# Patient Record
Sex: Male | Born: 1970 | Race: White | Hispanic: No | Marital: Married | Smoking: Former smoker
Health system: Southern US, Community
[De-identification: ages and names within clinical notes are randomized; demographics above are authoritative.]

## PROBLEM LIST (undated history)

## (undated) DIAGNOSIS — I10 Essential (primary) hypertension: Secondary | ICD-10-CM

## (undated) DIAGNOSIS — K219 Gastro-esophageal reflux disease without esophagitis: Secondary | ICD-10-CM

## (undated) HISTORY — PX: WISDOM TOOTH EXTRACTION: SHX21

## (undated) HISTORY — PX: APPENDECTOMY: SHX54

---

## 2008-06-10 ENCOUNTER — Inpatient Hospital Stay: Payer: Self-pay | Admitting: Internal Medicine

## 2008-06-10 ENCOUNTER — Ambulatory Visit: Payer: Self-pay | Admitting: Internal Medicine

## 2008-06-10 IMAGING — CT CT ABD-PELV W/ CM
2 of 3 series · 15 of 42 positions shown, 19 images · non-contrast
Comparison: none

REASON FOR EXAM: eval appendicitis  RLQ abd pn  fever CALL REPORT Dr COLIPAN
COLIPAN [PHONE_NUMBER]
COMMENTS:

[Series 2: appendicitis · axial · 0.72mm/px · z∈[-156,+288]mm · 12 of 171 slices shown, 16 images]
[im 15/171  soft-tissue]
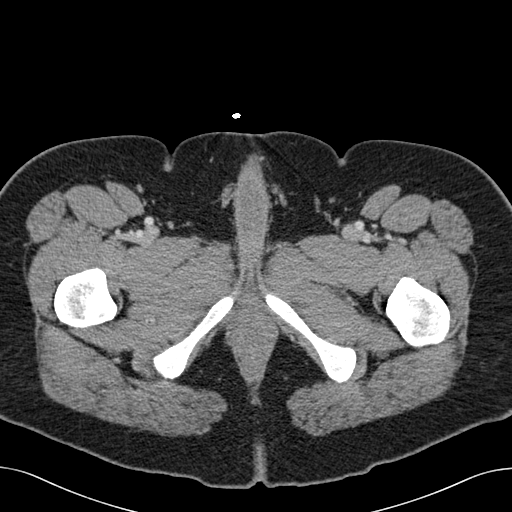
[im 15/171  bone]
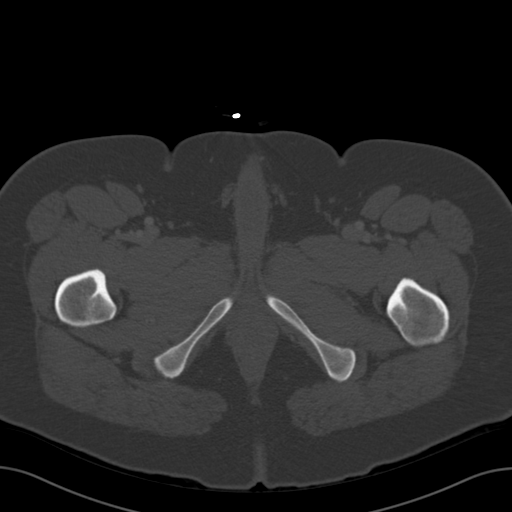
[im 29/171  soft-tissue]
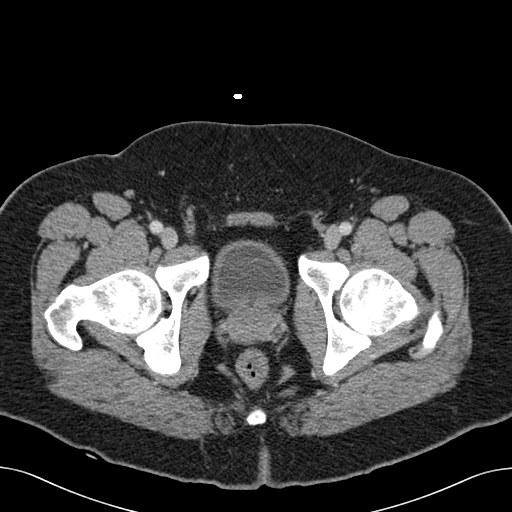
[im 43/171  soft-tissue]
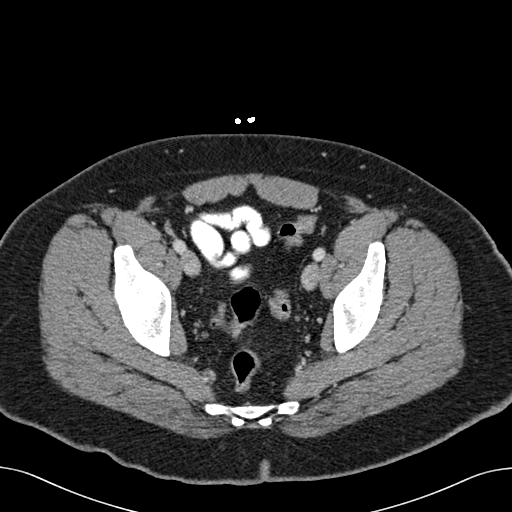
[im 64/171  soft-tissue]
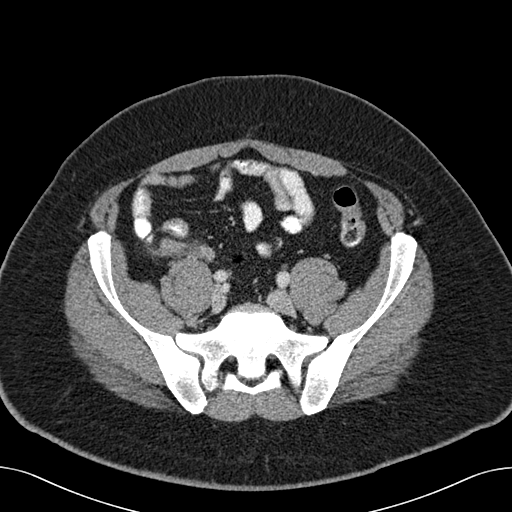
[im 78/171  soft-tissue]
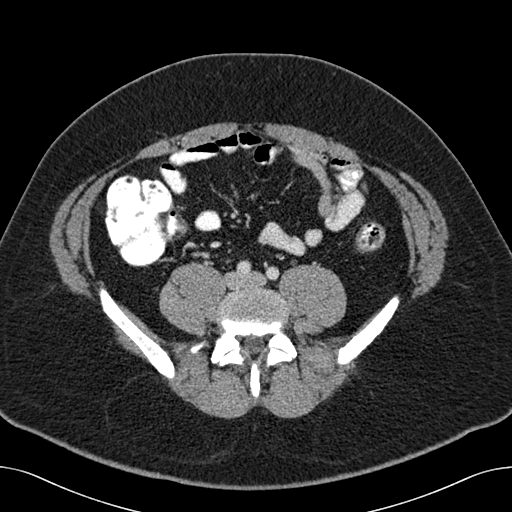
[im 93/171  soft-tissue]
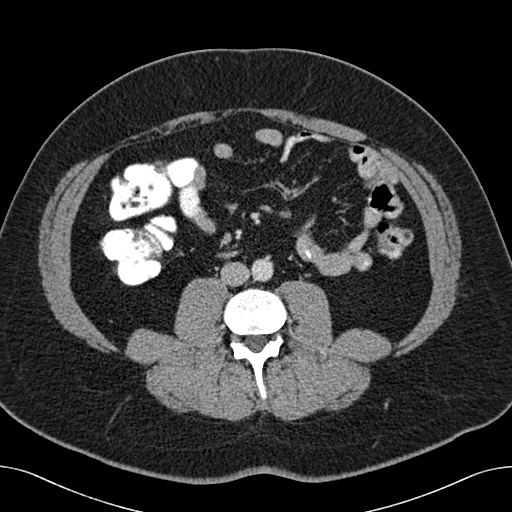
[im 107/171  soft-tissue]
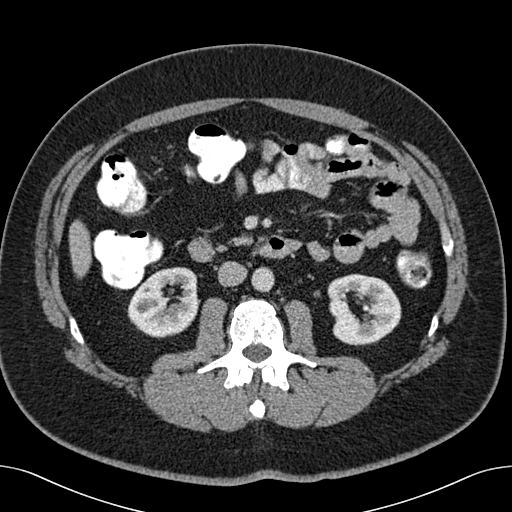
[im 128/171  soft-tissue]
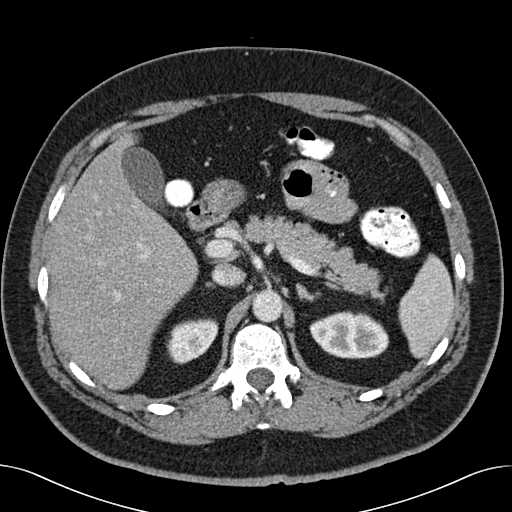
[im 142/171  soft-tissue]
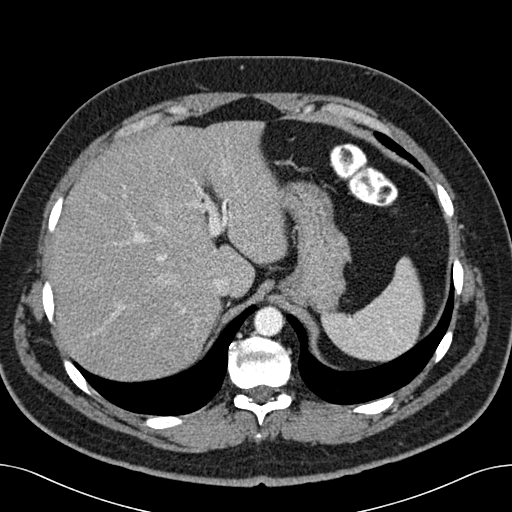
[im 142/171  lung]
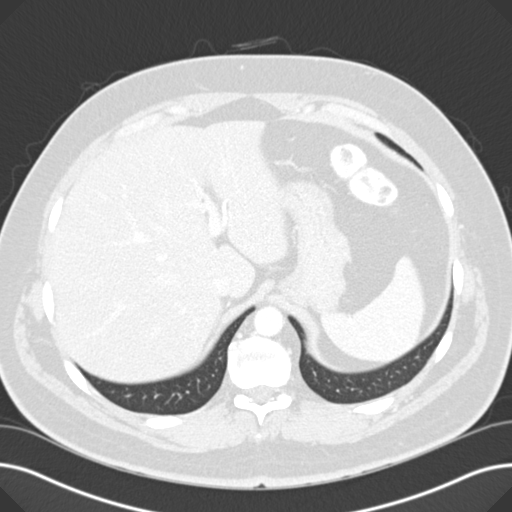
[im 142/171  bone]
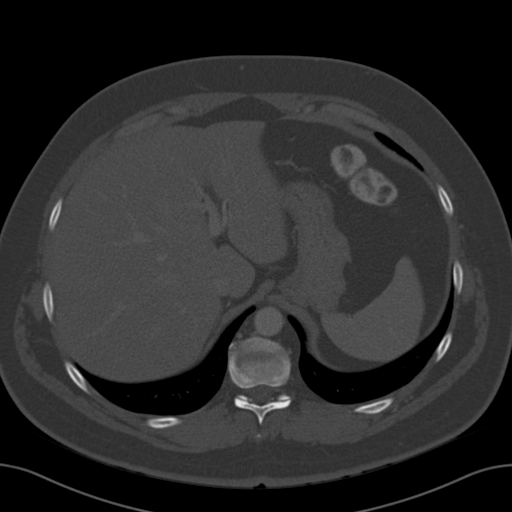
[im 149/171  lung]
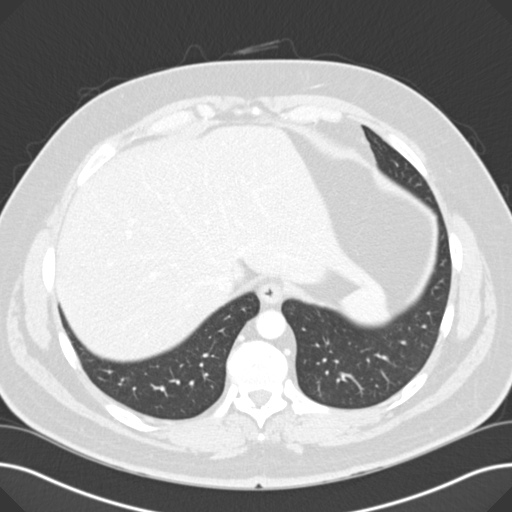
[im 156/171  soft-tissue]
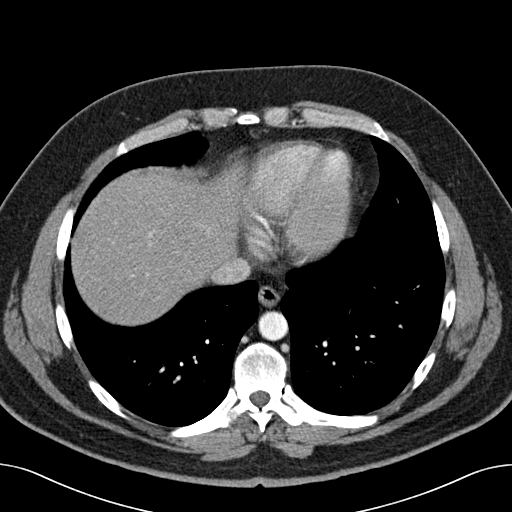
[im 156/171  lung]
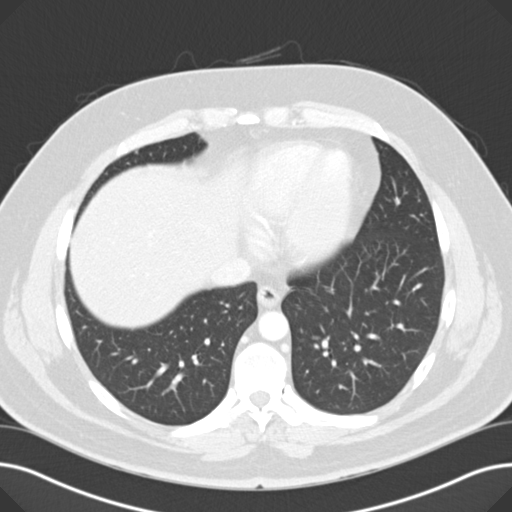
[im 163/171  lung]
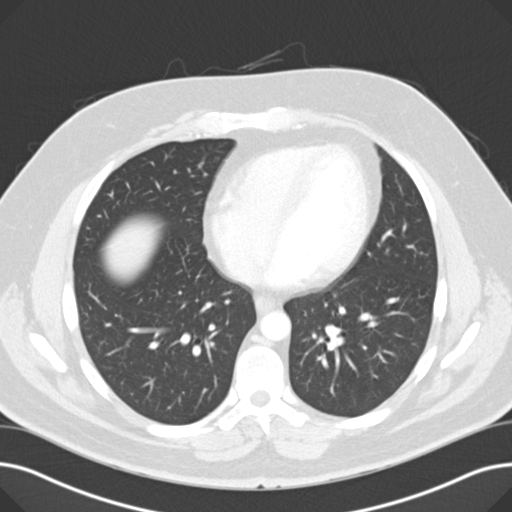

[Series 602: coronal · coronal · 1.02mm/px · 3 of 98 slices shown]
[im 33/98  soft-tissue]
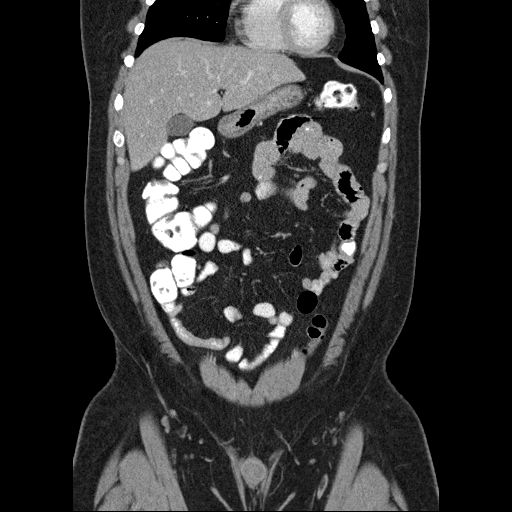
[im 44/98  soft-tissue]
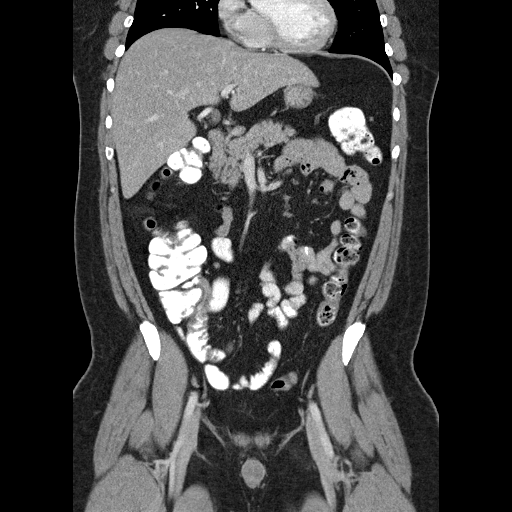
[im 54/98  soft-tissue]
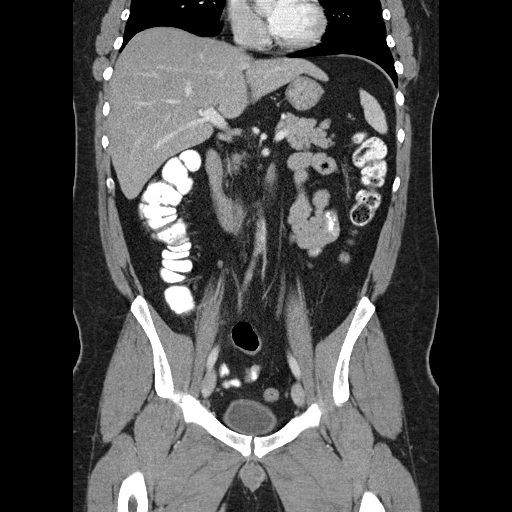

[15 of 42 positions shown; findings below may reference images not displayed]

PROCEDURE:     CT  - CT ABDOMEN / PELVIS  W  - [DATE]  [DATE]

RESULT:     The patient received 100 cc of Isovue 370 for this study. The
patient also received oral contrast material.

In the right lower quadrant of the abdomen there is increased density
surrounding the appendix, and the appendix itself appears mildly edematous.
This is seen on images 105 to 112. There is no evidence of bowel obstruction
or perforation. The remainder of the partially contrast filled colon is
normal in appearance. I see no abnormality of the small bowel.

The liver, gallbladder, spleen, nondistended stomach, adrenal glands,
pancreas, and kidneys exhibit no acute abnormality. The caliber of the
abdominal aorta is normal. There is no evidence of ascites. The partially
distended urinary bladder is normal in appearance. There is no inguinal nor
umbilical hernia. The lung bases are clear.
IMPRESSION: 1. There is mildly increased density in the periappendiceal fat and the
appendix itself is mildly edematous. These findings are consistent with
early appendicitis. There is no evidence of perforation or abscess
formation.
2. I do not see acute abnormality elsewhere within the abdomen or pelvis.

This report called directly to Dr. COLIPAN at the conclusion of the study.

## 2016-04-14 ENCOUNTER — Encounter: Payer: Self-pay | Admitting: *Deleted

## 2016-04-28 NOTE — Discharge Instructions (Signed)

## 2016-05-05 ENCOUNTER — Ambulatory Visit
Admission: RE | Admit: 2016-05-05 | Discharge: 2016-05-05 | Disposition: A | Discharge status: Home / Self Care | Payer: BLUE CROSS/BLUE SHIELD | Source: Ambulatory Visit | Attending: Unknown Physician Specialty | Admitting: Unknown Physician Specialty

## 2016-05-05 ENCOUNTER — Ambulatory Visit: Payer: BLUE CROSS/BLUE SHIELD | Admitting: Anesthesiology

## 2016-05-05 ENCOUNTER — Encounter
Admission: RE | Disposition: A | Discharge status: Home / Self Care | Payer: Self-pay | Source: Ambulatory Visit | Attending: Unknown Physician Specialty

## 2016-05-05 DIAGNOSIS — I10 Essential (primary) hypertension: Secondary | ICD-10-CM | POA: Insufficient documentation

## 2016-05-05 DIAGNOSIS — Z87891 Personal history of nicotine dependence: Secondary | ICD-10-CM | POA: Diagnosis not present

## 2016-05-05 DIAGNOSIS — R1310 Dysphagia, unspecified: Secondary | ICD-10-CM | POA: Insufficient documentation

## 2016-05-05 DIAGNOSIS — J383 Other diseases of vocal cords: Secondary | ICD-10-CM | POA: Insufficient documentation

## 2016-05-05 DIAGNOSIS — K219 Gastro-esophageal reflux disease without esophagitis: Secondary | ICD-10-CM | POA: Diagnosis not present

## 2016-05-05 HISTORY — DX: Essential (primary) hypertension: I10

## 2016-05-05 HISTORY — PX: MICROLARYNGOSCOPY: SHX5208

## 2016-05-05 HISTORY — DX: Gastro-esophageal reflux disease without esophagitis: K21.9

## 2016-05-05 SURGERY — MICROLARYNGOSCOPY
Anesthesia: General | Laterality: Left | Wound class: Clean Contaminated

## 2016-05-05 MED ORDER — MIDAZOLAM HCL 5 MG/5ML IJ SOLN
INTRAMUSCULAR | Status: DC | PRN
  Administered 2016-05-05: 2 mg via INTRAVENOUS

## 2016-05-05 MED ORDER — FENTANYL CITRATE (PF) 100 MCG/2ML IJ SOLN: 25.0000 ug | INTRAMUSCULAR | Status: DC | PRN

## 2016-05-05 MED ORDER — SUCCINYLCHOLINE CHLORIDE 20 MG/ML IJ SOLN
INTRAMUSCULAR | Status: DC | PRN
  Administered 2016-05-05: 100 mg via INTRAVENOUS

## 2016-05-05 MED ORDER — OXYCODONE HCL 5 MG PO TABS: 5.0000 mg | ORAL_TABLET | Freq: Once | ORAL | Status: DC | PRN

## 2016-05-05 MED ORDER — PROPOFOL 10 MG/ML IV BOLUS
INTRAVENOUS | Status: DC | PRN
  Administered 2016-05-05: 50 mg via INTRAVENOUS
  Administered 2016-05-05: 200 mg via INTRAVENOUS

## 2016-05-05 MED ORDER — GLYCOPYRROLATE 0.2 MG/ML IJ SOLN
INTRAMUSCULAR | Status: DC | PRN
  Administered 2016-05-05: 0.2 mg via INTRAVENOUS

## 2016-05-05 MED ORDER — DEXAMETHASONE SODIUM PHOSPHATE 4 MG/ML IJ SOLN
INTRAMUSCULAR | Status: DC | PRN
  Administered 2016-05-05: 8 mg via INTRAVENOUS

## 2016-05-05 MED ORDER — ONDANSETRON HCL 4 MG/2ML IJ SOLN
INTRAMUSCULAR | Status: DC | PRN
  Administered 2016-05-05: 4 mg via INTRAVENOUS

## 2016-05-05 MED ORDER — ACETAMINOPHEN 10 MG/ML IV SOLN
INTRAVENOUS | Status: DC | PRN
  Administered 2016-05-05: 1000 mg via INTRAVENOUS

## 2016-05-05 MED ORDER — OXYCODONE HCL 5 MG/5ML PO SOLN: 5.0000 mg | Freq: Once | ORAL | Status: DC | PRN

## 2016-05-05 MED ORDER — OXYCODONE-ACETAMINOPHEN 5-325 MG PO TABS: 1.0000 | ORAL_TABLET | Freq: Four times a day (QID) | ORAL | 0 refills | Status: DC | PRN

## 2016-05-05 MED ORDER — LACTATED RINGERS IV SOLN
INTRAVENOUS | Status: DC
  Administered 2016-05-05: 11:00:00 via INTRAVENOUS

## 2016-05-05 MED ORDER — LIDOCAINE HCL (CARDIAC) 20 MG/ML IV SOLN
INTRAVENOUS | Status: DC | PRN
  Administered 2016-05-05: 40 mg via INTRAVENOUS

## 2016-05-05 MED ORDER — ONDANSETRON HCL 4 MG/2ML IJ SOLN: 4.0000 mg | Freq: Once | INTRAMUSCULAR | Status: DC | PRN

## 2016-05-05 SURGICAL SUPPLY — 25 items
BASIN GRAD PLASTIC 32OZ STRL (MISCELLANEOUS) ×3 IMPLANT
BLOCK BITE GUARD (MISCELLANEOUS) ×3 IMPLANT
CNTNR SPEC 2.5X3XGRAD LEK (MISCELLANEOUS) ×1
CONT SPEC 4OZ STER OR WHT (MISCELLANEOUS) ×2
CONTAINER SPEC 2.5X3XGRAD LEK (MISCELLANEOUS) ×1 IMPLANT
COVER MAYO STAND STRL (DRAPES) ×3 IMPLANT
COVER TABLE BACK 60X90 (DRAPES) ×3 IMPLANT
CUP MEDICINE 2OZ PLAST GRAD ST (MISCELLANEOUS) ×3 IMPLANT
DRAPE SHEET LG 3/4 BI-LAMINATE (DRAPES) ×3 IMPLANT
DRESSING TELFA 4X3 1S ST N-ADH (GAUZE/BANDAGES/DRESSINGS) ×3 IMPLANT
GLOVE BIO SURGEON STRL SZ7.5 (GLOVE) ×3 IMPLANT
KIT ROOM TURNOVER OR (KITS) ×3 IMPLANT
MARKER SKIN DUAL TIP RULER LAB (MISCELLANEOUS) ×3 IMPLANT
NEEDLE FILTER BLUNT 18X 1/2SAF (NEEDLE) ×2
NEEDLE FILTER BLUNT 18X1 1/2 (NEEDLE) ×1 IMPLANT
NS IRRIG 500ML POUR BTL (IV SOLUTION) ×3 IMPLANT
PATTIES SURGICAL .5 X.5 (GAUZE/BANDAGES/DRESSINGS) ×3 IMPLANT
SOL ANTI-FOG 6CC FOG-OUT (MISCELLANEOUS) ×1 IMPLANT
SOL FOG-OUT ANTI-FOG 6CC (MISCELLANEOUS) ×2
SPONGE XRAY 4X4 16PLY STRL (MISCELLANEOUS) ×3 IMPLANT
STRAP BODY AND KNEE 60X3 (MISCELLANEOUS) ×3 IMPLANT
SYRINGE 10CC LL (SYRINGE) ×3 IMPLANT
TOWEL OR 17X26 4PK STRL BLUE (TOWEL DISPOSABLE) ×3 IMPLANT
TUBING CONN 6MMX3.1M (TUBING) ×2
TUBING SUCTION CONN 0.25 STRL (TUBING) ×1 IMPLANT

## 2016-05-05 NOTE — H&P (Signed)
  H+P  Reviewed and will be scanned in later. No changes noted. 

## 2016-05-05 NOTE — Transfer of Care (Signed)
Immediate Anesthesia Transfer of Care Note  Patient: Lance DuvalStephen B Coto  Procedure(s) Performed: Procedure(s): SUSPENSION MICROLARYNGOSCOPY WITH LEFT VOCAL CORD LESION EXCISION (Left)  Patient Location: PACU  Anesthesia Type: General ETT  Level of Consciousness: awake, alert  and patient cooperative  Airway and Oxygen Therapy: Patient Spontanous Breathing and Patient connected to supplemental oxygen  Post-op Assessment: Post-op Vital signs reviewed, Patient's Cardiovascular Status Stable, Respiratory Function Stable, Patent Airway and No signs of Nausea or vomiting  Post-op Vital Signs: Reviewed and stable  Complications: No apparent anesthesia complications

## 2016-05-05 NOTE — Anesthesia Procedure Notes (Deleted)
Performed by: Cyril Woodmansee       

## 2016-05-05 NOTE — Op Note (Signed)
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 After general endotracheal anesthesia the table was turned 90. A tooth guard was placed. A Dedo laryngoscope was introduced into the airway and suspended. 0 endoscope was introduced into the airway and examination of the larynx showed granulomatous lesion of the left posterior vocal fold over the vocal process. The operating microscope was brought into the field a cottonoid pledget with phenylephrine lidocaine was used to bathe the larynx. Approximate 5 minutes this was removed the 45 Schaap Shea microlaryngeal instruments were then introduced in the left vocal granuloma was excised in its entirety. Cottonoid pledget was then used to bathe the larynx again reinspection with the microscope as well as the 0 endoscope showed excellent removal of the granulomatous process. The right of the larynx was unremarkable. The patient was in return anesthesia where he was extubated in the operating room taken recovery room in stable condition.  Cultures: None  Specimen: Left vocal cord process granuloma  This may blood loss less than 5 cc  Dispo:   Good  Plan:  Discharge to home on voice rest  Tillman Kazmierski T  05/05/2016 12:11 PM

## 2016-05-05 NOTE — Anesthesia Procedure Notes (Signed)
Procedure Name: Intubation Performed by: Londell Moh Pre-anesthesia Checklist: Patient identified, Emergency Drugs available, Suction available, Patient being monitored and Timeout performed Patient Re-evaluated:Patient Re-evaluated prior to inductionOxygen Delivery Method: Circle system utilized Preoxygenation: Pre-oxygenation with 100% oxygen Intubation Type: IV induction Ventilation: Mask ventilation without difficulty Laryngoscope Size: Mac and 3 Grade View: Grade II Tube type: Oral Tube size: 7.0 mm Number of attempts: 1 Airway Equipment and Method: Bougie stylet and LTA kit utilized Placement Confirmation: ETT inserted through vocal cords under direct vision,  positive ETCO2 and breath sounds checked- equal and bilateral Tube secured with: Tape Dental Injury: Teeth and Oropharynx as per pre-operative assessment  Difficulty Due To: Difficult Airway- due to anterior larynx

## 2016-05-05 NOTE — Anesthesia Preprocedure Evaluation (Signed)
Anesthesia Evaluation  Patient identified by MRN, date of birth, ID band Patient awake    Reviewed: Allergy & Precautions, H&P , NPO status   Airway Mallampati: II  TM Distance: >3 FB Neck ROM: full    Dental   Pulmonary former smoker,    breath sounds clear to auscultation       Cardiovascular hypertension,  Rhythm:regular Rate:Normal     Neuro/Psych    GI/Hepatic GERD  ,  Endo/Other    Renal/GU      Musculoskeletal   Abdominal   Peds  Hematology   Anesthesia Other Findings   Reproductive/Obstetrics                             Anesthesia Physical Anesthesia Plan  ASA: II  Anesthesia Plan: General ETT   Post-op Pain Management:    Induction:   Airway Management Planned:   Additional Equipment:   Intra-op Plan:   Post-operative Plan:   Informed Consent: I have reviewed the patients History and Physical, chart, labs and discussed the procedure including the risks, benefits and alternatives for the proposed anesthesia with the patient or authorized representative who has indicated his/her understanding and acceptance.     Plan Discussed with: CRNA  Anesthesia Plan Comments:         Anesthesia Quick Evaluation

## 2016-05-05 NOTE — Anesthesia Postprocedure Evaluation (Signed)
Anesthesia Post Note  Patient: Lonna DuvalStephen B Barrette  Procedure(s) Performed: Procedure(s) (LRB): SUSPENSION MICROLARYNGOSCOPY WITH LEFT VOCAL CORD LESION EXCISION (Left)  Patient location during evaluation: PACU Anesthesia Type: General Level of consciousness: awake and alert Pain management: pain level controlled Vital Signs Assessment: post-procedure vital signs reviewed and stable Respiratory status: spontaneous breathing, nonlabored ventilation, respiratory function stable and patient connected to nasal cannula oxygen Cardiovascular status: blood pressure returned to baseline and stable Postop Assessment: no signs of nausea or vomiting Anesthetic complications: no    Durene Fruitshomas,  Basilio Meadow G

## 2016-05-08 ENCOUNTER — Encounter: Payer: Self-pay | Admitting: Unknown Physician Specialty

## 2016-05-10 LAB — SURGICAL PATHOLOGY

## 2018-05-29 ENCOUNTER — Other Ambulatory Visit: Payer: Self-pay | Admitting: Internal Medicine

## 2018-05-29 DIAGNOSIS — M25512 Pain in left shoulder: Secondary | ICD-10-CM

## 2022-03-03 ENCOUNTER — Other Ambulatory Visit: Payer: Self-pay | Admitting: Internal Medicine

## 2022-03-03 DIAGNOSIS — M549 Dorsalgia, unspecified: Secondary | ICD-10-CM

## 2022-03-14 ENCOUNTER — Ambulatory Visit
Admission: RE | Admit: 2022-03-14 | Discharge: 2022-03-14 | Disposition: A | Discharge status: Home / Self Care | Payer: BLUE CROSS/BLUE SHIELD | Source: Ambulatory Visit | Attending: Internal Medicine | Admitting: Internal Medicine

## 2022-03-14 ENCOUNTER — Ambulatory Visit
Admission: RE | Admit: 2022-03-14 | Discharge: 2022-03-14 | Disposition: A | Discharge status: Home / Self Care | Payer: BC Managed Care – PPO | Source: Ambulatory Visit | Attending: Internal Medicine | Admitting: Internal Medicine

## 2022-03-14 DIAGNOSIS — M549 Dorsalgia, unspecified: Secondary | ICD-10-CM

## 2022-03-14 IMAGING — MR MR LUMBAR SPINE WO/W CM
4 of 7 series · 19 of 48 positions shown · IV contrast (20 mL Multihance)
Comparison: None Available.

CLINICAL DATA: Mid and low back pain and weakness for 8 months.
Numbness in the left great toe.

EXAM:
MRI LUMBAR SPINE WITHOUT AND WITH CONTRAST
TECHNIQUE: Multiplanar and multiecho pulse sequences of the lumbar spine were
obtained without and with intravenous contrast.
CONTRAST:  20 mL MULTIHANCE GADOBENATE DIMEGLUMINE 529 MG/ML IV SOLN

[Series 1: T2 · sagittal · 4.0mm · 0.73mm/px · 4 of 15 slices shown (1 of 2)]
[im 1/15]
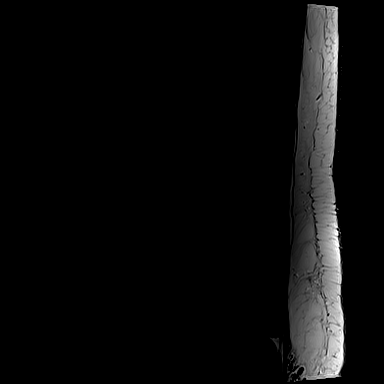
[im 5/15]
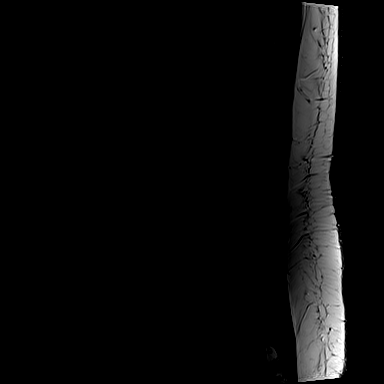
[im 10/15]
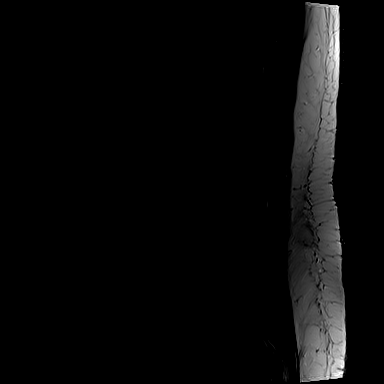
[im 15/15]
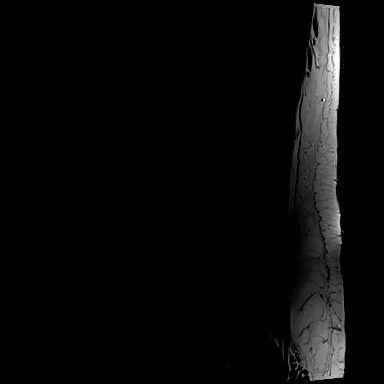

[Series 2: T1 · sagittal · 4.0mm · 0.73mm/px · 3 of 15 slices shown (1 of 2)]
[im 1/15]
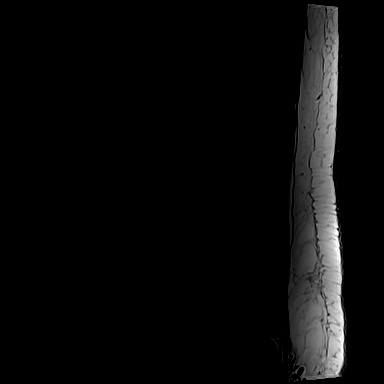
[im 10/15]
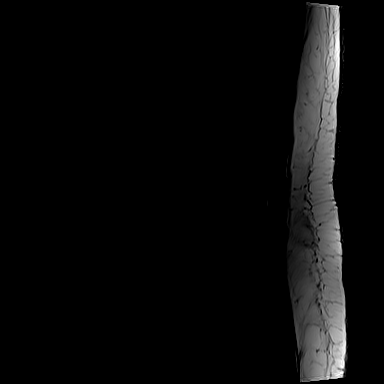
[im 15/15]
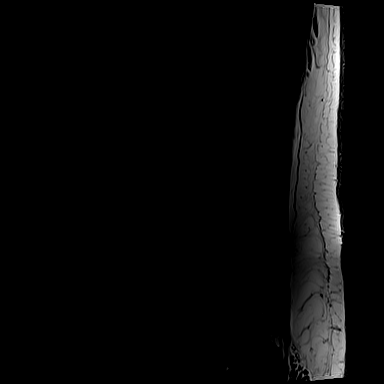

[Series 6: T1 · axial · 4.0mm · 0.28mm/px · z∈[-488,-315]mm · 3 of 42 slices shown (2 of 2)]
[im 6/42]
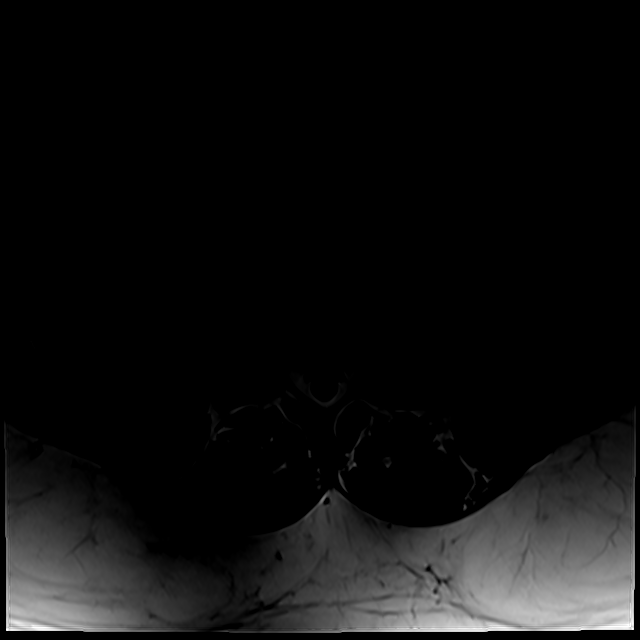
[im 21/42]
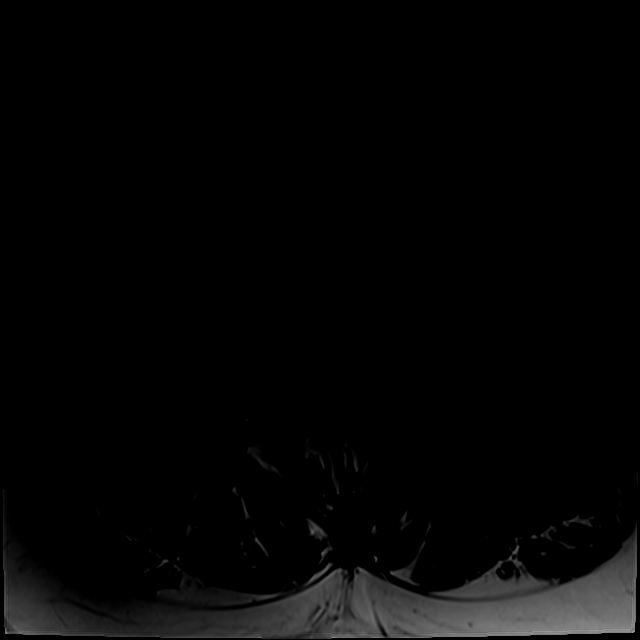
[im 36/42]
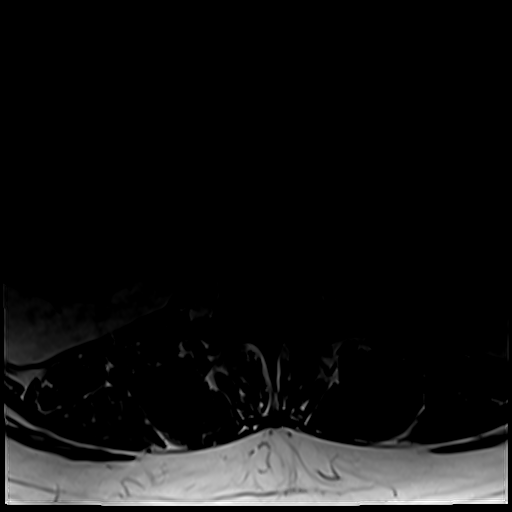

[Series 9: T2 · axial · 4.0mm · 0.28mm/px · z∈[-513,-285]mm · 9 of 42 slices shown (2 of 2)]
[im 1/42]
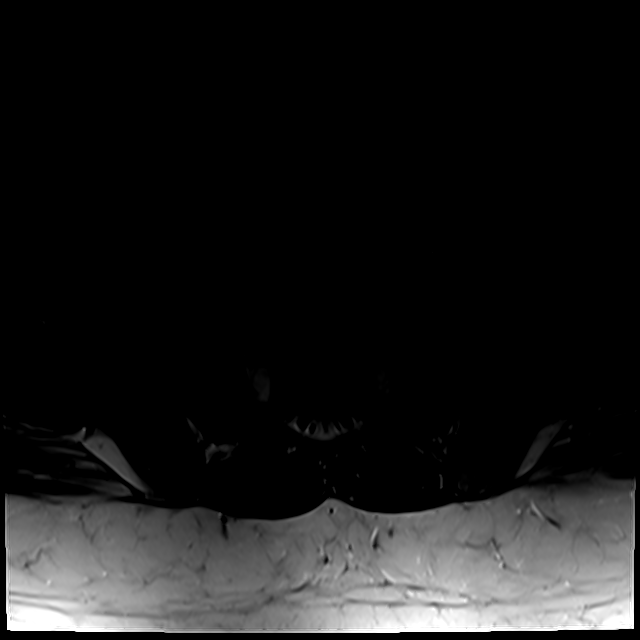
[im 6/42]
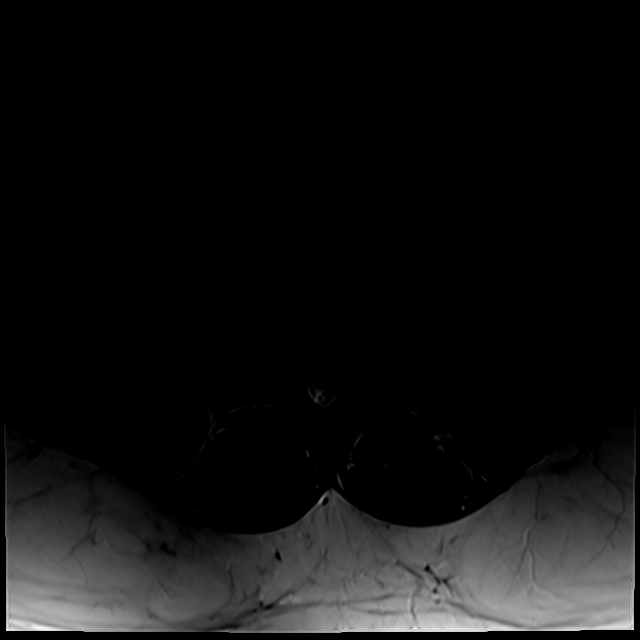
[im 11/42]
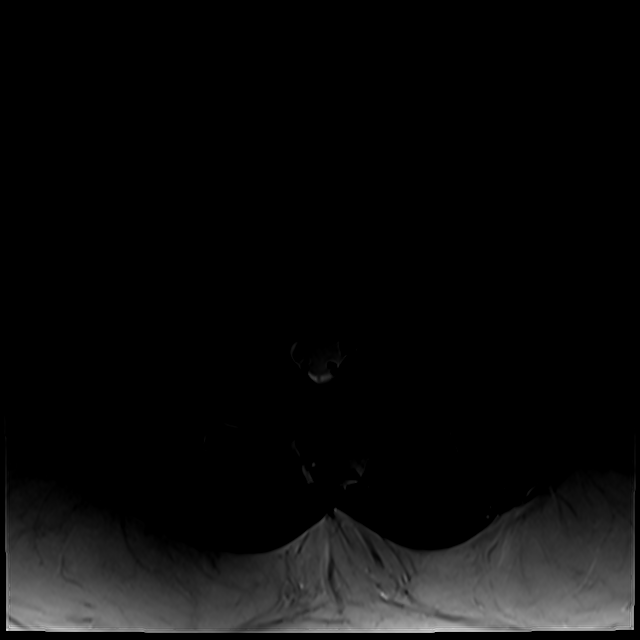
[im 16/42]
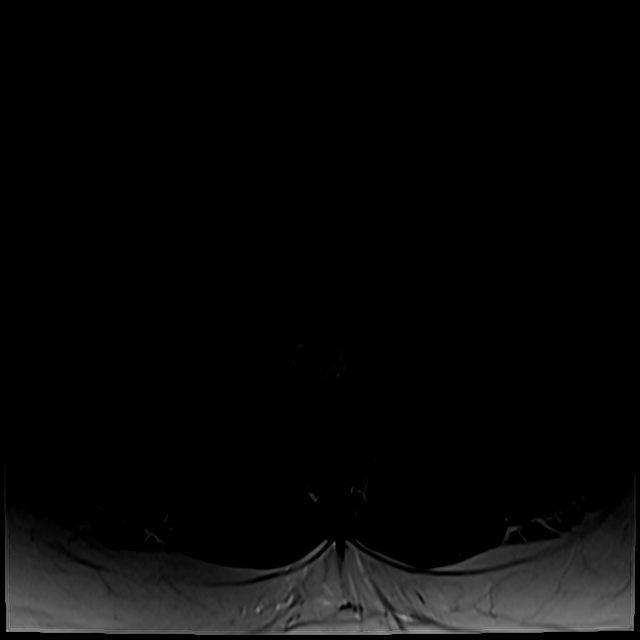
[im 21/42]
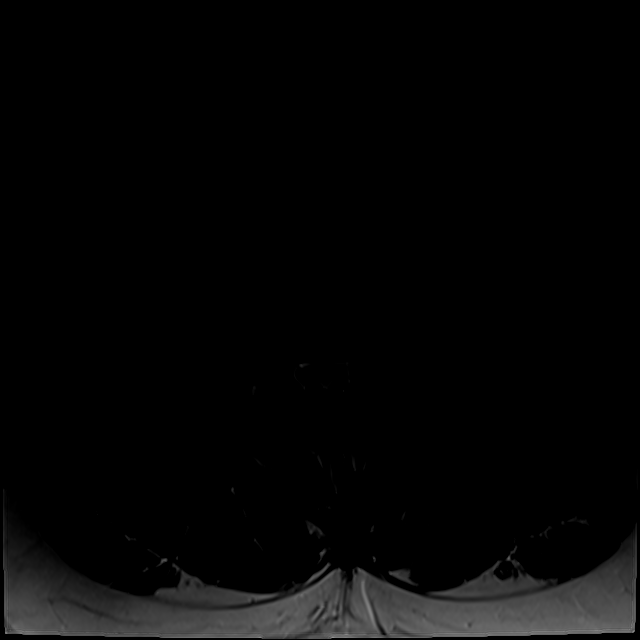
[im 26/42]
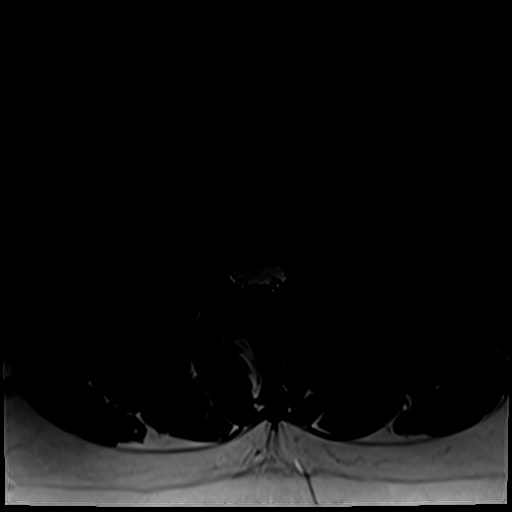
[im 31/42]
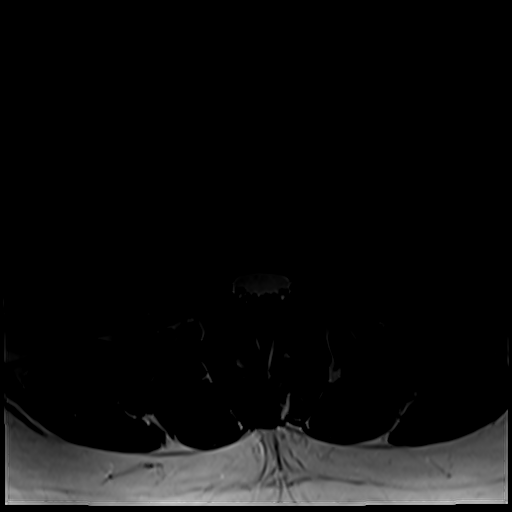
[im 36/42]
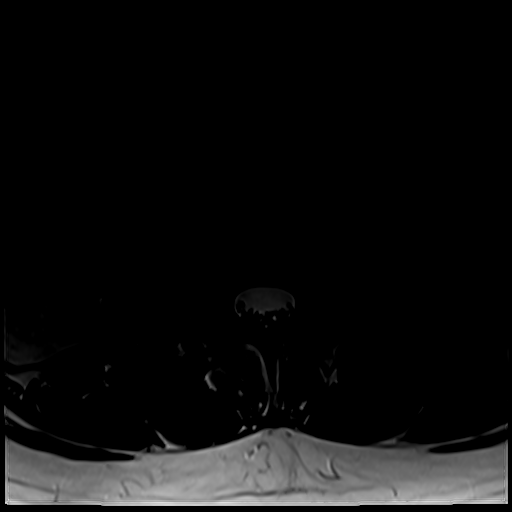
[im 42/42]
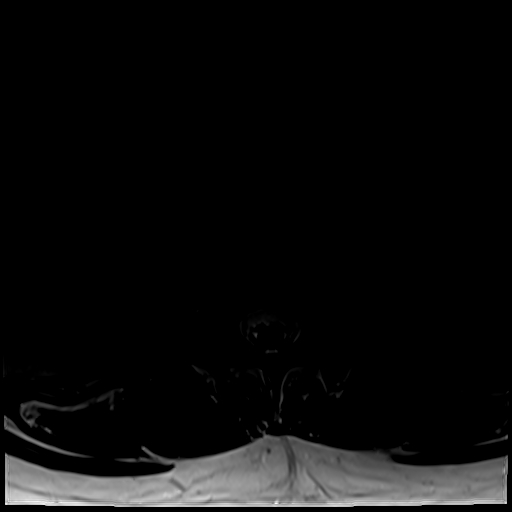

[19 of 48 positions shown; findings below may reference images not displayed]

FINDINGS: Segmentation:  Standard.

Alignment:  Normal.

Vertebrae: No fracture, evidence of discitis or worrisome bony
lesion. Benign hemangioma in L2 is noted.

Conus medullaris and cauda equina: Conus extends to the L1 level.
Conus and cauda equina appear normal.

Paraspinal and other soft tissues: Negative.

Disc levels:

The T12-L1 to L3-4 levels are negative.

L4-5: The patient has a shallow disc bulge and mild facet
degenerative change. The central canal is widely patent. Mild
bilateral foraminal narrowing is more notable on the right.

L5-S1: Shallow disc bulge without stenosis.
IMPRESSION: Shallow disc bulge at L4-5 results in mild bilateral foraminal
narrowing, more notable on the right. The central canal is widely
patent.

Minimal disc bulge L5-S1 without stenosis.

## 2022-03-14 IMAGING — MR MR THORACIC SPINE WO/W CM
5 of 9 series · 25 of 48 positions shown · IV contrast (20 mL Multihance)
Comparison: None Available.

EXAM:
MRI THORACIC WITHOUT AND WITH CONTRAST
TECHNIQUE: Multiplanar and multiecho pulse sequences of the thoracic spine were
obtained without and with intravenous contrast.

CONTRAST:  20mL MULTIHANCE GADOBENATE DIMEGLUMINE 529 MG/ML IV SOLN

[Series 6: T1 · sagittal · 3.0mm · 0.83mm/px · 3 of 15 slices shown (1 of 2)]
[im 1/15]
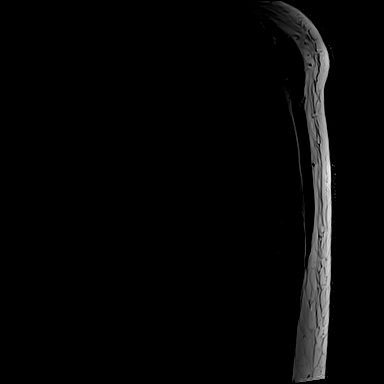
[im 8/15]
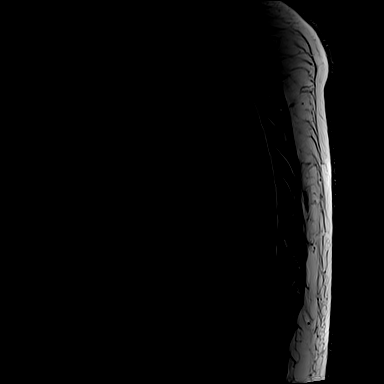
[im 15/15]
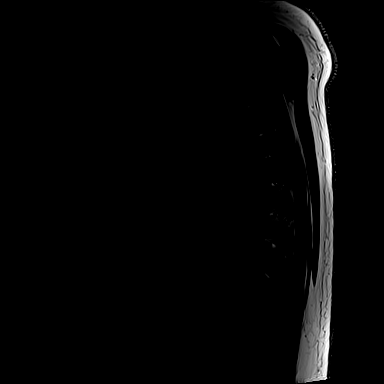

[Series 8: T2 · sagittal · 3.0mm · 0.83mm/px · 3 of 15 slices shown (1 of 2)]
[im 1/15]
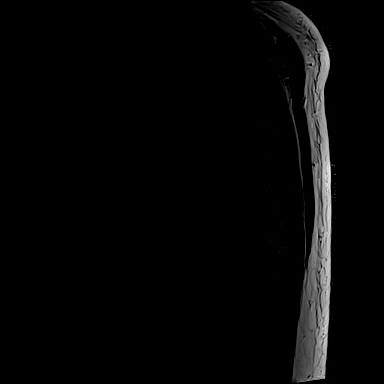
[im 8/15]
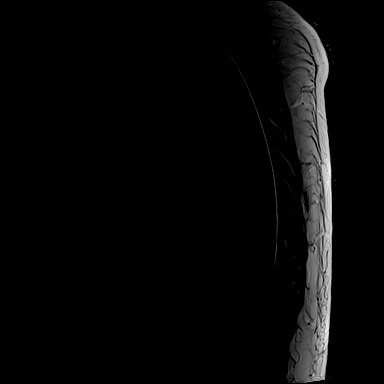
[im 15/15]
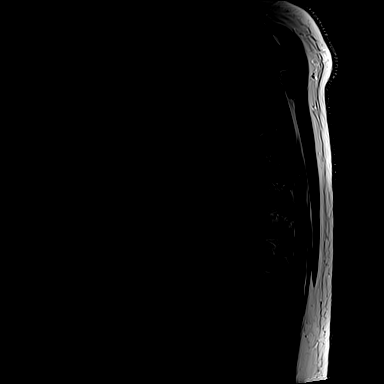

[Series 9: T2 · axial · 4.0mm · 0.28mm/px · z∈[-284,-73]mm · 8 of 36 slices shown (2 of 2)]
[im 1/36]
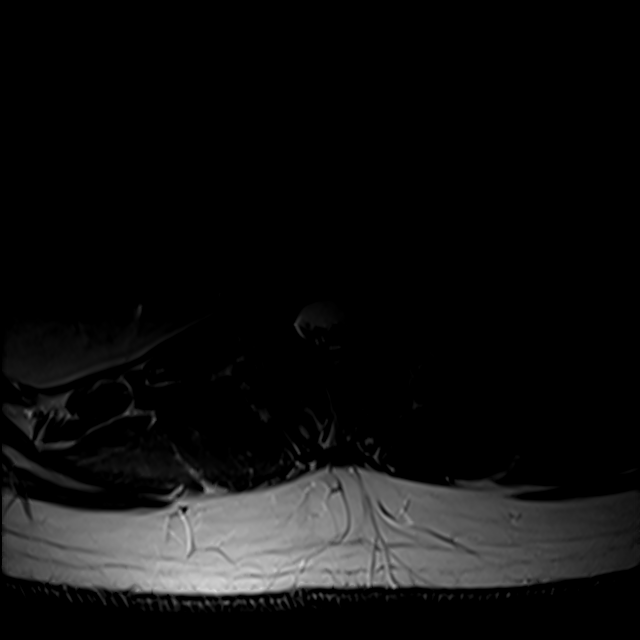
[im 6/36]
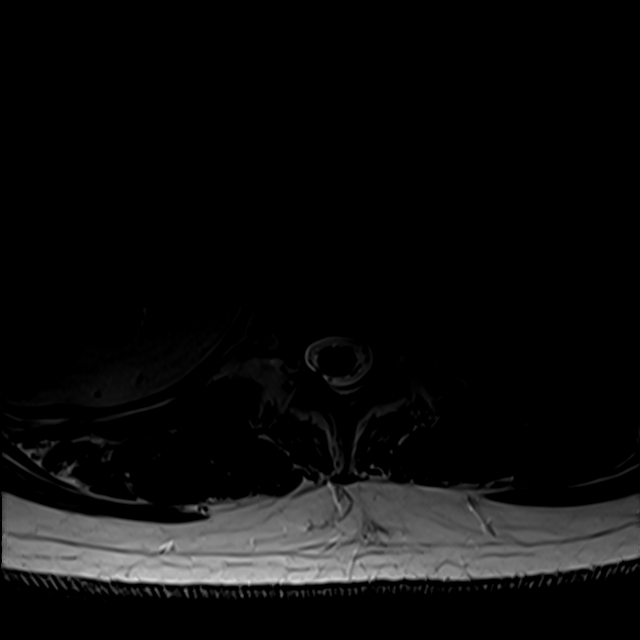
[im 11/36]
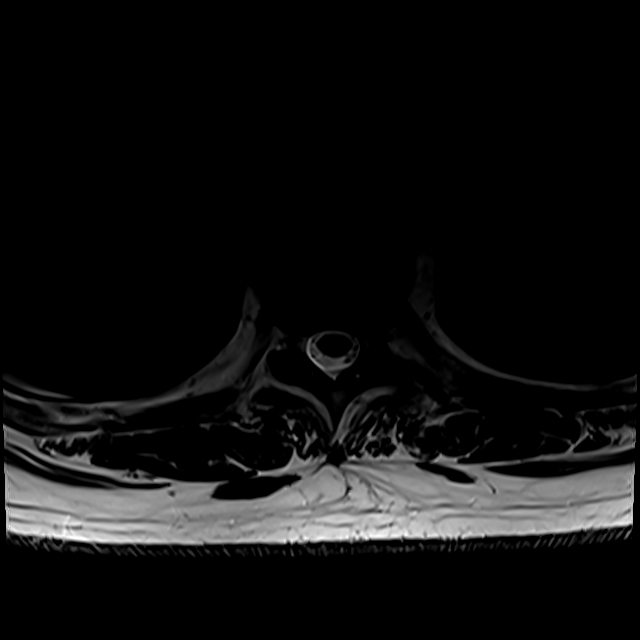
[im 16/36]
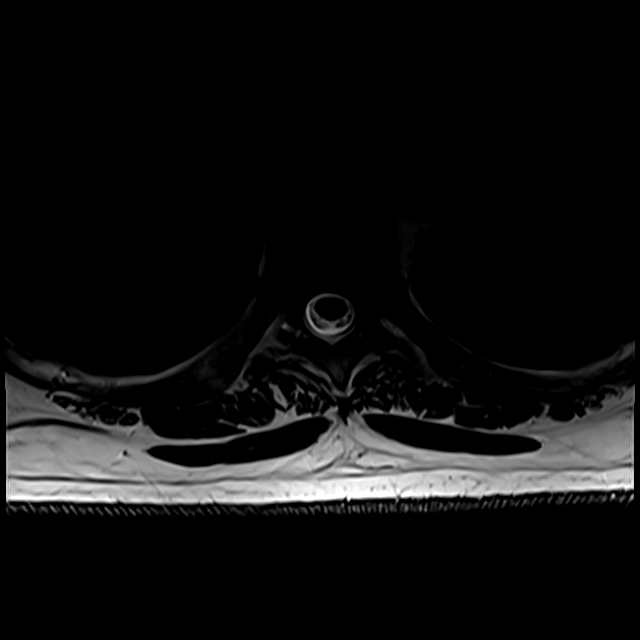
[im 21/36]
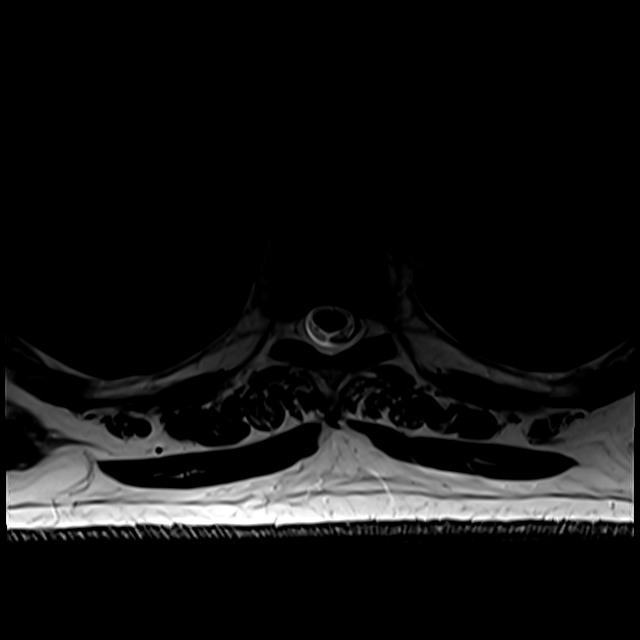
[im 26/36]
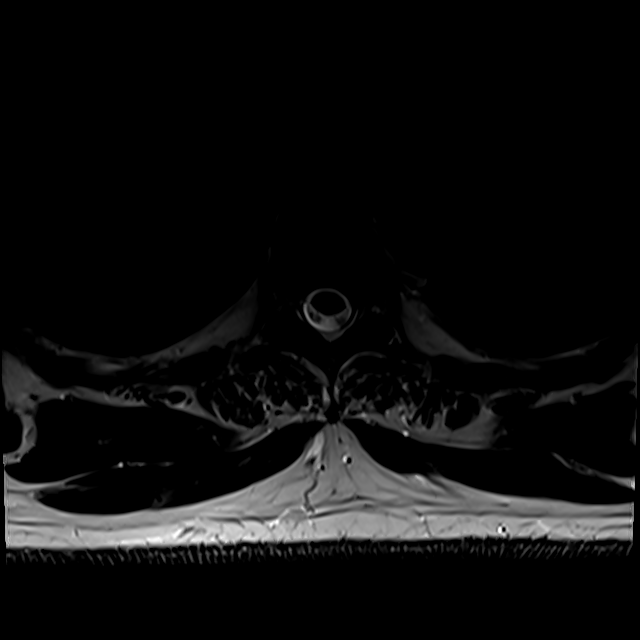
[im 31/36]
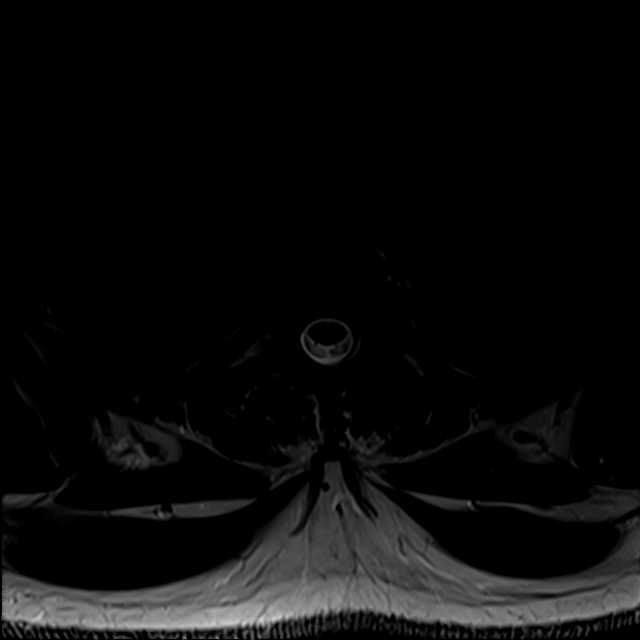
[im 36/36]
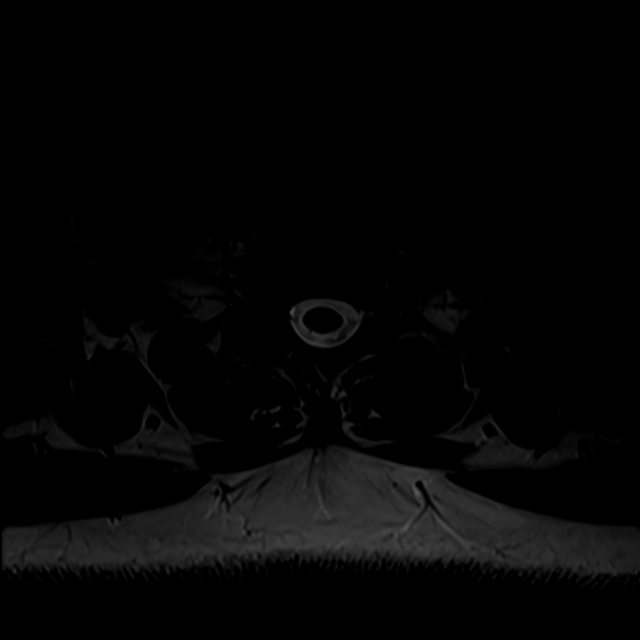

[Series 11: T1 · axial · non-contrast · 4.0mm · 0.56mm/px · z∈[-284,-73]mm · 8 of 36 slices shown (2 of 2)]
[im 1/36]
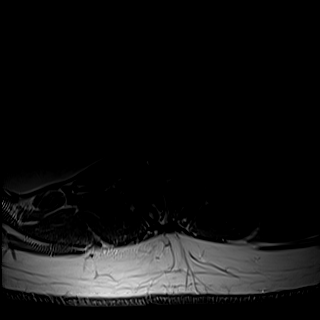
[im 6/36]
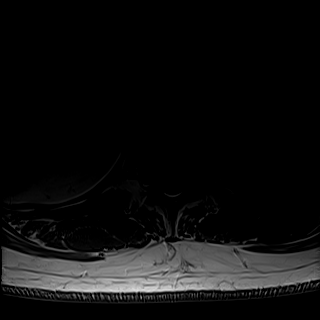
[im 11/36]
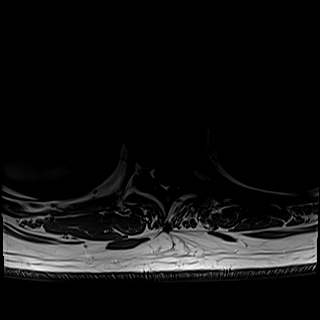
[im 16/36]
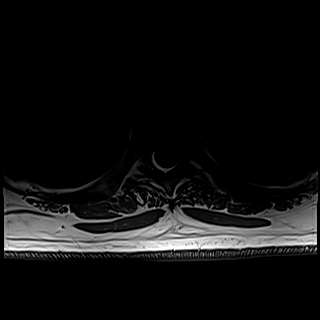
[im 21/36]
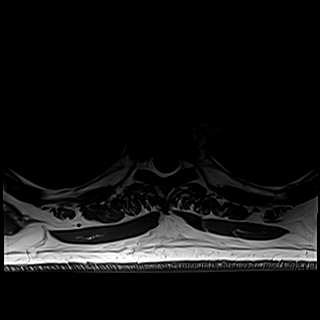
[im 26/36]
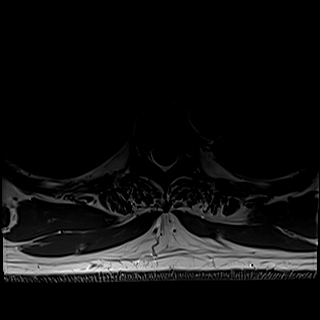
[im 31/36]
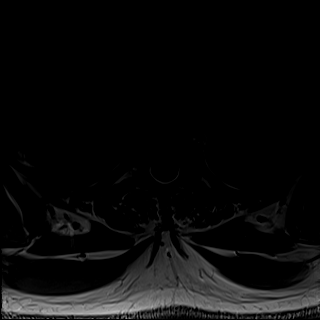
[im 36/36]
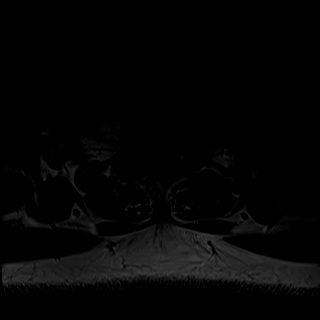

[Series 12: T1 fat-sat · sagittal · 3.0mm · 1.00mm/px · 3 of 15 slices shown]
[im 1/15]
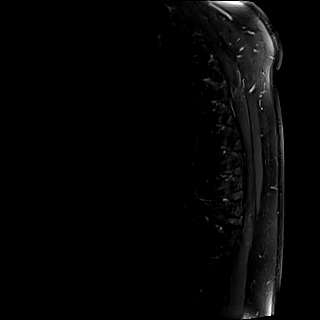
[im 8/15]
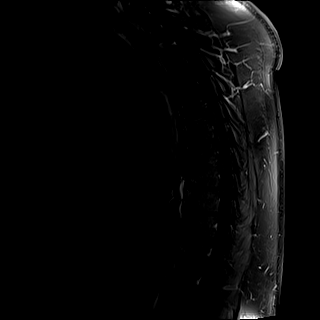
[im 15/15]
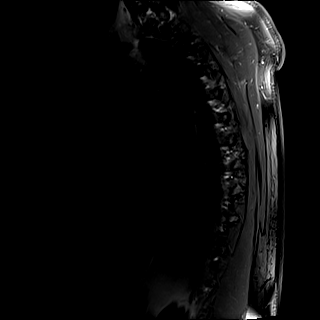

[25 of 48 positions shown; findings below may reference images not displayed]

FINDINGS: Alignment:  Physiologic.

Vertebrae: No fracture, evidence of discitis, or bone lesion.

Cord:  Normal signal and morphology.

Paraspinal and other soft tissues: Negative.

Disc levels:

Mild multilevel degenerate disc disease of the lower thoracic spine
with disc height loss. No significant disc protrusion, spinal canal
or neural foraminal stenosis.
IMPRESSION: 1.  Visualized cord is normal in signal and morphology.

2. Mild multilevel degenerate disc disease of the thoracic spine
without significant disc protrusion, spinal canal or neural
foraminal stenosis.

## 2022-03-14 MED ORDER — GADOBENATE DIMEGLUMINE 529 MG/ML IV SOLN
20.0000 mL | Freq: Once | INTRAVENOUS | Status: AC | PRN
  Administered 2022-03-14: 20 mL via INTRAVENOUS

## 2023-03-25 NOTE — Progress Notes (Addendum)
Referring Physician:  Barbette Reichmann, MD [address] [facility] [place],  [place] [address]  Primary Physician:  Barbette Reichmann, MD  History of Present Illness: 03/27/2023 Lance Buchanan has a history of OSA, depression, HTN, low vitamin B12 and D, likely fatty liver.   18 month history of constant upper LBP with no leg pain that is worse with moving, lifting, bending. Pain varies in severity. No pain with sitting. Thinks pain started after trying to lift his tractor 2 years ago. No numbness, tingling, or weakness.   He takes prn motrin.   Bowel/Bladder Dysfunction: none  Conservative measures:  Physical therapy: has not participated  Multimodal medical therapy including regular antiinflammatories: cymbalta, percocet, advil  Injections: has not received epidural steroid injections  Past Surgery: no previous spine surgery  Lance Buchanan has no symptoms of cervical myelopathy.  The symptoms are causing a significant impact on the patient's life.   Review of Systems:  A 10 point review of systems is negative, except for the pertinent positives and negatives detailed in the HPI.  Past Medical History: Past Medical History:  Diagnosis Date   GERD (gastroesophageal reflux disease)    Hypertension     Past Surgical History: Past Surgical History:  Procedure Laterality Date   APPENDECTOMY     MICROLARYNGOSCOPY Left 05/05/2016   Procedure: SUSPENSION MICROLARYNGOSCOPY WITH LEFT VOCAL CORD LESION EXCISION;  Surgeon: Linus Salmons, MD;  Location: [facility];  Service: ENT;  Laterality: Left;   WISDOM TOOTH EXTRACTION      Allergies: Allergies as of 03/27/2023 - Review Complete 03/27/2023  Allergen Reaction Noted   Erythromycin Other (See Comments) 04/14/2016   Penicillins Other (See Comments) 07/28/2014    Medications: Outpatient Encounter Medications as of 03/27/2023  Medication Sig   cyanocobalamin (VITAMIN B12) 1000 MCG tablet Take  1,000 mcg by mouth daily.   DULoxetine (CYMBALTA) 60 MG capsule Take 60 mg by mouth daily.   ibuprofen (ADVIL) 200 MG tablet Take 600 mg by mouth as needed.   rosuvastatin (CRESTOR) 10 MG tablet Take 10 mg by mouth daily.   buPROPion (WELLBUTRIN SR) 150 MG 12 hr tablet Take 150 mg by mouth daily.   metoprolol tartrate (LOPRESSOR) 25 MG tablet Take 25 mg by mouth daily.   omeprazole (PRILOSEC) 40 MG capsule Take 40 mg by mouth daily.   venlafaxine (EFFEXOR) 75 MG tablet Take 75 mg by mouth daily.   [DISCONTINUED] oxyCODONE-acetaminophen (ROXICET) 5-325 MG tablet Take 1-2 tablets by mouth every 6 (six) hours as needed for severe pain.   No facility-administered encounter medications on file as of 03/27/2023.    Social History: Social History   Tobacco Use   Smoking status: Former    Types: Cigarettes   Smokeless tobacco: Former    Quit date: 09/26/1995  Substance Use Topics   Alcohol use: Yes    Alcohol/week: 15.0 standard drinks of alcohol    Types: 15 Cans of beer per week    Family Medical History: History reviewed. No pertinent family history.  Physical Examination: Vitals:   03/27/23 1310  BP: (!) 140/90    General: Patient is well developed, well nourished, calm, collected, and in no apparent distress. Attention to examination is appropriate.  Respiratory: Patient is breathing without any difficulty.   NEUROLOGICAL:     Awake, alert, oriented to person, place, and time.  Speech is clear and fluent. Fund of knowledge is appropriate.   Cranial Nerves: Pupils equal round and reactive  to light.  Facial tone is symmetric.    No posterior lumbar tenderness.   No abnormal lesions on exposed skin.   Strength: Side Biceps Triceps Deltoid Interossei Grip Wrist Ext. Wrist Flex.  R 5 5 5 5 5 5 5   L 5 5 5 5 5 5 5    Side Iliopsoas Quads Hamstring PF DF EHL  R 5 5 5 5 5 5   L 5 5 5 5 5 5    Reflexes are 2+ and symmetric at the biceps, brachioradialis, patella and achilles.    Hoffman's is absent.  Clonus is not present.   Bilateral upper and lower extremity sensation is intact to light touch.     Gait is normal.    Medical Decision Making  Imaging: MRI lumbar spine dated 03/14/22:  FINDINGS: Segmentation:  Standard.   Alignment:  Normal.   Vertebrae: No fracture, evidence of discitis or worrisome bony lesion. Benign hemangioma in L2 is noted.   Conus medullaris and cauda equina: Conus extends to the L1 level. Conus and cauda equina appear normal.   Paraspinal and other soft tissues: Negative.   Disc levels:   The T12-L1 to L3-4 levels are negative.   L4-5: The patient has a shallow disc bulge and mild facet degenerative change. The central canal is widely patent. Mild bilateral foraminal narrowing is more notable on the right.   L5-S1: Shallow disc bulge without stenosis.   IMPRESSION: Shallow disc bulge at L4-5 results in mild bilateral foraminal narrowing, more notable on the right. The central canal is widely patent.   Minimal disc bulge L5-S1 without stenosis.     Electronically Signed   By: Drusilla Kanner M.D.   On: 03/16/2022 08:39   MRI of thoracic spine dated 03/14/22:  FINDINGS: Alignment:  Physiologic.   Vertebrae: No fracture, evidence of discitis, or bone lesion.   Cord:  Normal signal and morphology.   Paraspinal and other soft tissues: Negative.   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 No significant disc protrusion, spinal canal or neural foraminal stenosis.   IMPRESSION: 1.  Visualized cord is normal in signal and morphology.   2. 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     Electronically Signed   By: Larose Hires D.O.   On: 03/15/2022 09:40   I have personally reviewed the images and agree with the above  interpretation.  Assessment and Plan: Lance Buchanan is a pleasant 52 y.o. male has 18 month history of constant upper LBP with no leg pain that is worse with moving, lifting, bending. No numbness, tingling, or weakness.   He has known mild thoracis and lumbar spondylosis. His pain is more in the TL junction area.   Treatment options discussed with patient and following plan made:   - Order for physical therapy for lumbar spine sent to [facility]. Patient to call to schedule appointment.  - Continue current medications including prn OTC motrin. Reviewed dosing and side effects. Take with food.  - If pain gets worse, can consider referral for possible lumbar injections at [facility].  - Follow up with me in 6-8 weeks and prn.   BP was elevated. No symptoms of chest pain, shortness of breath, blurry vision, or headaches. He will recheck at home and call PCP if not improved. If he develops CP, SOB, blurry vision, or headaches, then he  will go to ED.     I spent a total of 25 minutes in face-to-face and non-face-to-face activities related to this patient's care today including review of outside records, review of imaging, review of symptoms, physical exam, discussion of differential diagnosis, discussion of treatment options, and documentation.   Thank you for involving me in the care of this patient.   ADDENDUM 05/11/23:  See PT notes under media. He did 8 visits with good improvement in pain.   Drake Leach PA-C Dept. of Neurosurgery

## 2023-03-27 ENCOUNTER — Ambulatory Visit (INDEPENDENT_AMBULATORY_CARE_PROVIDER_SITE_OTHER): Payer: BC Managed Care – PPO | Admitting: Orthopedic Surgery

## 2023-03-27 ENCOUNTER — Encounter: Payer: Self-pay | Admitting: Orthopedic Surgery

## 2023-03-27 VITALS — BP 140/90 | Ht 71.0 in | Wt 244.0 lb

## 2023-03-27 DIAGNOSIS — M47816 Spondylosis without myelopathy or radiculopathy, lumbar region: Secondary | ICD-10-CM | POA: Diagnosis not present

## 2023-03-27 NOTE — Patient Instructions (Signed)
It was so nice to see you today. Thank you so much for coming in.    You have some minimal wear and tear in your back and this may be contributing to your pain.   I sent physical therapy orders to [facility]. You can call them at [phone] if you don't hear from them to schedule your visit.   Okay to continue over the counter motrin as directed on bottle  to help with pain and inflammation. Take with food.   If pain gets worse, can consider referral to see physical medicine and rehab at the [facility] to discuss possible injections in your back.   Your blood pressure was elevated today. I recommend you recheck it at home and follow up with your PCP if it remains high. If you have any chest pain, shortness of breath, blurry vision, or headaches then you need to go to ED.    I will see you back in 6-8 weeks. Please do not hesitate to call if you have any questions or concerns. You can also message me in MyChart.   Drake Leach PA-C [phone]

## 2023-05-22 NOTE — Progress Notes (Deleted)
Referring Physician:  Barbette Reichmann, MD [address] [facility] [place],  [place] [address]  Primary Physician:  Barbette Reichmann, MD  History of Present Illness: 05/22/2023 [name] has a history of OSA, depression, HTN, low vitamin B12 and D, likely fatty liver.   Last seen by me on 03/27/23 for upper LBP with no leg pain. He has known mild thoracic and lumbar spondylosis. His pain is more in the TL junction area.   He was sent to PT and did 8 visits with improvement in his pain. He is here for follow up.        18 month history of constant upper LBP with no leg pain that is worse with moving, lifting, bending. Pain varies in severity. No pain with sitting. Thinks pain started after trying to lift his tractor 2 years ago. No numbness, tingling, or weakness.   He takes prn motrin.   Bowel/Bladder Dysfunction: none  Conservative measures:  Physical therapy: Yes 8 visits (see under media) Multimodal medical therapy including regular antiinflammatories: cymbalta, percocet, advil  Injections: has not received epidural steroid injections  Past Surgery: no previous spine surgery  Lance Buchanan has no symptoms of cervical myelopathy.  The symptoms are causing a significant impact on the patient's life.   Review of Systems:  A 10 point review of systems is negative, except for the pertinent positives and negatives detailed in the HPI.  Past Medical History: Past Medical History:  Diagnosis Date   GERD (gastroesophageal reflux disease)    Hypertension     Past Surgical History: Past Surgical History:  Procedure Laterality Date   APPENDECTOMY     MICROLARYNGOSCOPY Left 05/05/2016   Procedure: SUSPENSION MICROLARYNGOSCOPY WITH LEFT VOCAL CORD LESION EXCISION;  Surgeon: Linus Salmons, MD;  Location: [facility];  Service: ENT;  Laterality: Left;   WISDOM TOOTH EXTRACTION      Allergies: Allergies as of 05/24/2023 - Review Complete  03/27/2023  Allergen Reaction Noted   Erythromycin Other (See Comments) 04/14/2016   Penicillins Other (See Comments) 07/28/2014    Medications: Outpatient Encounter Medications as of 05/24/2023  Medication Sig   buPROPion (WELLBUTRIN SR) 150 MG 12 hr tablet Take 150 mg by mouth daily.   cyanocobalamin (VITAMIN B12) 1000 MCG tablet Take 1,000 mcg by mouth daily.   DULoxetine (CYMBALTA) 60 MG capsule Take 60 mg by mouth daily.   ibuprofen (ADVIL) 200 MG tablet Take 600 mg by mouth as needed.   metoprolol tartrate (LOPRESSOR) 25 MG tablet Take 25 mg by mouth daily.   omeprazole (PRILOSEC) 40 MG capsule Take 40 mg by mouth daily.   rosuvastatin (CRESTOR) 10 MG tablet Take 10 mg by mouth daily.   venlafaxine (EFFEXOR) 75 MG tablet Take 75 mg by mouth daily.   No facility-administered encounter medications on file as of 05/24/2023.    Social History: Social History   Tobacco Use   Smoking status: Former    Types: Cigarettes   Smokeless tobacco: Former    Quit date: 09/26/1995  Substance Use Topics   Alcohol use: Yes    Alcohol/week: 15.0 standard drinks of alcohol    Types: 15 Cans of beer per week    Family Medical History: No family history on file.  Physical Examination: There were no vitals filed for this visit.    Awake, alert, oriented to person, place, and time.  Speech is clear and fluent. Fund of knowledge is appropriate.   Cranial Nerves: Pupils equal  round and reactive to light.  Facial tone is symmetric.    No posterior lumbar tenderness.   No abnormal lesions on exposed skin.   Strength: Side Biceps Triceps Deltoid Interossei Grip Wrist Ext. Wrist Flex.  R 5 5 5 5 5 5 5   L 5 5 5 5 5 5 5    Side Iliopsoas Quads Hamstring PF DF EHL  R 5 5 5 5 5 5   L 5 5 5 5 5 5    Reflexes are 2+ and symmetric at the biceps, brachioradialis, patella and achilles.   Hoffman's is absent.  Clonus is not present.   Bilateral upper and lower extremity sensation is intact to  light touch.     Gait is normal.    Medical Decision Making Nothing new to review.   Imaging: Assessment and Plan: [name] is a pleasant 52 y.o. male has 18 month history of constant upper LBP with no leg pain that is worse with moving, lifting, bending. No numbness, tingling, or weakness.   He has known mild thoracis and lumbar spondylosis. His pain is more in the TL junction area.   Treatment options discussed with patient and following plan made:   - Order for physical therapy for lumbar spine sent to [facility]. Patient to call to schedule appointment.  - Continue current medications including prn OTC motrin. Reviewed dosing and side effects. Take with food.  - If pain gets worse, can consider referral for possible lumbar injections at [facility].  - Follow up with me in 6-8 weeks and prn.   BP was elevated. No symptoms of chest pain, shortness of breath, blurry vision, or headaches. He will recheck at home and call PCP if not improved. If he develops CP, SOB, blurry vision, or headaches, then he will go to ED.     I spent a total of 25 minutes in face-to-face and non-face-to-face activities related to this patient's care today including review of outside records, review of imaging, review of symptoms, physical exam, discussion of differential diagnosis, discussion of treatment options, and documentation.   Thank you for involving me in the care of this patient.   ADDENDUM 05/11/23:  See PT notes under media. He did 8 visits with good improvement in pain.   Lance Leach PA-C Dept. of Neurosurgery

## 2023-05-24 ENCOUNTER — Ambulatory Visit: Payer: BC Managed Care – PPO | Admitting: Orthopedic Surgery
